# Patient Record
Sex: Female | Born: 1940
Health system: Southern US, Community
[De-identification: ages and names within clinical notes are randomized; demographics above are authoritative.]

## PROBLEM LIST (undated history)

## (undated) DIAGNOSIS — I10 Essential (primary) hypertension: Secondary | ICD-10-CM

## (undated) DIAGNOSIS — M549 Dorsalgia, unspecified: Secondary | ICD-10-CM

## (undated) HISTORY — PX: CHOLECYSTECTOMY: SHX55

---

## 2003-03-06 ENCOUNTER — Encounter: Payer: Self-pay | Admitting: Neurosurgery

## 2003-03-08 ENCOUNTER — Encounter: Payer: Self-pay | Admitting: Neurosurgery

## 2003-03-08 ENCOUNTER — Inpatient Hospital Stay (HOSPITAL_COMMUNITY): Admission: RE | Admit: 2003-03-08 | Discharge: 2003-03-09 | Payer: Self-pay | Admitting: Neurosurgery

## 2003-04-10 ENCOUNTER — Encounter: Admission: RE | Admit: 2003-04-10 | Discharge: 2003-04-10 | Payer: Self-pay | Admitting: Neurosurgery

## 2003-04-10 ENCOUNTER — Encounter: Payer: Self-pay | Admitting: Neurosurgery

## 2003-06-12 ENCOUNTER — Encounter: Payer: Self-pay | Admitting: Neurosurgery

## 2003-06-12 ENCOUNTER — Encounter: Admission: RE | Admit: 2003-06-12 | Discharge: 2003-06-12 | Payer: Self-pay | Admitting: Neurosurgery

## 2016-07-29 ENCOUNTER — Encounter (HOSPITAL_BASED_OUTPATIENT_CLINIC_OR_DEPARTMENT_OTHER): Payer: Self-pay | Admitting: *Deleted

## 2016-07-29 ENCOUNTER — Emergency Department (HOSPITAL_BASED_OUTPATIENT_CLINIC_OR_DEPARTMENT_OTHER)
Admission: EM | Admit: 2016-07-29 | Discharge: 2016-07-29 | Disposition: A | Discharge status: Home / Self Care | Payer: Medicare HMO | Attending: Emergency Medicine | Admitting: Emergency Medicine

## 2016-07-29 ENCOUNTER — Emergency Department (HOSPITAL_BASED_OUTPATIENT_CLINIC_OR_DEPARTMENT_OTHER): Payer: Medicare HMO

## 2016-07-29 DIAGNOSIS — Y939 Activity, unspecified: Secondary | ICD-10-CM | POA: Diagnosis not present

## 2016-07-29 DIAGNOSIS — S301XXA Contusion of abdominal wall, initial encounter: Secondary | ICD-10-CM | POA: Diagnosis not present

## 2016-07-29 DIAGNOSIS — W19XXXA Unspecified fall, initial encounter: Secondary | ICD-10-CM

## 2016-07-29 DIAGNOSIS — W01119A Fall on same level from slipping, tripping and stumbling with subsequent striking against unspecified sharp object, initial encounter: Secondary | ICD-10-CM | POA: Diagnosis not present

## 2016-07-29 DIAGNOSIS — Y9289 Other specified places as the place of occurrence of the external cause: Secondary | ICD-10-CM | POA: Diagnosis not present

## 2016-07-29 DIAGNOSIS — S0990XA Unspecified injury of head, initial encounter: Secondary | ICD-10-CM

## 2016-07-29 DIAGNOSIS — Y999 Unspecified external cause status: Secondary | ICD-10-CM | POA: Insufficient documentation

## 2016-07-29 DIAGNOSIS — S01112A Laceration without foreign body of left eyelid and periocular area, initial encounter: Secondary | ICD-10-CM | POA: Diagnosis not present

## 2016-07-29 HISTORY — DX: Essential (primary) hypertension: I10

## 2016-07-29 HISTORY — DX: Dorsalgia, unspecified: M54.9

## 2016-07-29 LAB — CBC WITH DIFFERENTIAL/PLATELET
Basophils Absolute: 0 10*3/uL (ref 0.0–0.1)
Basophils Relative: 0 %
EOS ABS: 0 10*3/uL (ref 0.0–0.7)
EOS PCT: 0 %
HCT: 32.9 % — ABNORMAL LOW (ref 36.0–46.0)
HEMOGLOBIN: 10.7 g/dL — AB (ref 12.0–15.0)
LYMPHS ABS: 1.1 10*3/uL (ref 0.7–4.0)
Lymphocytes Relative: 13 %
MCH: 29.8 pg (ref 26.0–34.0)
MCHC: 32.5 g/dL (ref 30.0–36.0)
MCV: 91.6 fL (ref 78.0–100.0)
MONO ABS: 0.5 10*3/uL (ref 0.1–1.0)
MONOS PCT: 6 %
Neutro Abs: 6.7 10*3/uL (ref 1.7–7.7)
Neutrophils Relative %: 81 %
PLATELETS: 308 10*3/uL (ref 150–400)
RBC: 3.59 MIL/uL — ABNORMAL LOW (ref 3.87–5.11)
RDW: 13.1 % (ref 11.5–15.5)
WBC: 8.2 10*3/uL (ref 4.0–10.5)

## 2016-07-29 LAB — COMPREHENSIVE METABOLIC PANEL
ALK PHOS: 58 U/L (ref 38–126)
ALT: 21 U/L (ref 14–54)
ANION GAP: 11 (ref 5–15)
AST: 27 U/L (ref 15–41)
Albumin: 4.2 g/dL (ref 3.5–5.0)
BUN: 29 mg/dL — ABNORMAL HIGH (ref 6–20)
CALCIUM: 9.7 mg/dL (ref 8.9–10.3)
CO2: 20 mmol/L — AB (ref 22–32)
Chloride: 106 mmol/L (ref 101–111)
Creatinine, Ser: 1.28 mg/dL — ABNORMAL HIGH (ref 0.44–1.00)
GFR calc non Af Amer: 40 mL/min — ABNORMAL LOW (ref >60)
GFR, EST AFRICAN AMERICAN: 46 mL/min — AB (ref >60)
Glucose, Bld: 84 mg/dL (ref 65–99)
Potassium: 4.5 mmol/L (ref 3.5–5.1)
SODIUM: 137 mmol/L (ref 135–145)
Total Bilirubin: 0.5 mg/dL (ref 0.3–1.2)
Total Protein: 7.9 g/dL (ref 6.5–8.1)

## 2016-07-29 MED ORDER — TETANUS-DIPHTH-ACELL PERTUSSIS 5-2.5-18.5 LF-MCG/0.5 IM SUSP: 0.5000 mL | Freq: Once | INTRAMUSCULAR | Status: DC

## 2016-07-29 MED ORDER — MORPHINE SULFATE (PF) 4 MG/ML IV SOLN
4.0000 mg | Freq: Once | INTRAVENOUS | Status: AC
  Administered 2016-07-29: 4 mg via INTRAVENOUS
  Filled 2016-07-29: qty 1

## 2016-07-29 MED ORDER — SODIUM CHLORIDE 0.9 % IV BOLUS (SEPSIS)
500.0000 mL | Freq: Once | INTRAVENOUS | Status: AC
  Administered 2016-07-29: 500 mL via INTRAVENOUS

## 2016-07-29 MED ORDER — LIDOCAINE-EPINEPHRINE (PF) 2 %-1:200000 IJ SOLN
10.0000 mL | Freq: Once | INTRAMUSCULAR | Status: AC
  Administered 2016-07-29: 10 mL
  Filled 2016-07-29: qty 10

## 2016-07-29 MED ORDER — IOPAMIDOL (ISOVUE-300) INJECTION 61%
100.0000 mL | Freq: Once | INTRAVENOUS | Status: AC | PRN
  Administered 2016-07-29: 100 mL via INTRAVENOUS

## 2016-07-29 NOTE — ED Notes (Signed)
PA at bedside.

## 2016-07-29 NOTE — ED Notes (Signed)
Patient transported to CT 

## 2016-07-29 NOTE — ED Provider Notes (Signed)
[facility]-EMERGENCY DEPT Provider Note   CSN: [id number] Arrival date & time: 07/29/16  1521  By signing my name below, I, Emmanuella Mensah, attest that this documentation has been prepared under the direction and in the presence of Applied MaterialsJessica Focht, PA-C. Electronically Signed: Angelene GiovanniEmmanuella Mensah, ED Scribe. 07/29/16. 5:19 PM.   History   Chief Complaint Chief Complaint  Patient presents with  . Fall    HPI Comments: Christina Harrison is a 75 y.o. female with a hx of HTN who presents to the Emergency Department complaining of a laceration above her left eyebrow, persistent moderate left frontal headache, and moderate right rib pain with bruising s/p fall that occurred 5 hours ago. She reports one associated episode of vomiting immediately after the fall. She explains that she was fishing when she stood up to ambulate but tripped over a stump falling on her right side unto the stump and striking her head onto the ground. She endorses positive LOC that lasted for approximately 10 minutes. She adds that she was dizzy when she gained consciousness. She states that she was able to drive home after the fall. No alleviating factors noted. Pt has not tried any medications PTA. No anti-coagulant use noted. Pt's tetanus vaccine is UTD.  Pt has not ambulated since being in the ED. She denies any acute visual disturbances, lightheadedness, numbness/tingling in BLE, shortness of breath, chest pain, nausea, abdominal pain, or any generalized rash.   The history is provided by the patient. No language interpreter was used.    Past Medical History:  Diagnosis Date  . Back pain   . Hypertension     There are no active problems to display for this patient.   Past Surgical History:  Procedure Laterality Date  . CHOLECYSTECTOMY      OB History    No data available       Home Medications    Prior to Admission medications   Medication Sig Start Date End Date Taking? Authorizing Provider  traMADol  (ULTRAM) 50 MG tablet Take by mouth every 6 (six) hours as needed.   Yes Historical Provider, MD  UNKNOWN TO PATIENT    Yes Historical Provider, MD    Family History History reviewed. No pertinent family history.  Social History Social History  Substance Use Topics  . Smoking status: Never Smoker  . Smokeless tobacco: Current User    Types: Snuff  . Alcohol use Yes     Comment: >4 cans of beer daily     Allergies   Review of patient's allergies indicates no known allergies.   Review of Systems Review of Systems  Eyes: Negative for visual disturbance.  Respiratory: Negative for shortness of breath.   Cardiovascular: Negative for chest pain.  Gastrointestinal: Positive for vomiting. Negative for abdominal pain and nausea.  Musculoskeletal: Positive for arthralgias.  Skin: Positive for wound. Negative for rash.  Neurological: Positive for dizziness and headaches. Negative for light-headedness and numbness.  All other systems reviewed and are negative.    Physical Exam Updated Vital Signs BP 118/74 (BP Location: Right Arm)   Pulse 86   Temp 99.3 F (37.4 C) (Oral)   Resp 18   Ht 5\' 6"  (1.676 m)   Wt 142 lb (64.4 kg)   SpO2 99%   BMI 22.92 kg/m   Physical Exam  Constitutional: She is oriented to person, place, and time. She appears well-developed and well-nourished. No distress.  HENT:  Head: Normocephalic.  Left Ear: Tympanic membrane and ear canal  normal. No hemotympanum.  Cannot visualize right ear due to cerumen  1.5 cm laceration noted above medial left eyebrow  Eyes: EOM are normal. Pupils are equal, round, and reactive to light. Right conjunctiva is not injected. Left conjunctiva is injected.  Injected conjunctiva to left but not the right  Neck: Normal range of motion.  Cardiovascular: Normal rate, regular rhythm and normal heart sounds.   Pulmonary/Chest: Effort normal and breath sounds normal. No respiratory distress.  Abdominal: Soft. Bowel sounds are  normal. She exhibits no distension. There is no tenderness.  No abdominal ecchymosis  Musculoskeletal: Normal range of motion.  No tenderness on spine or paraspinal muscles  Tenderness to right posterior rib with small area of ecchymosis noted to right flank Small area of ecchymosis noted to left medial ankle 2+ DP pulses Strength 5/5 BLE   Neurological: She is alert and oriented to person, place, and time. She has normal strength. No cranial nerve deficit or sensory deficit. Coordination normal. GCS eye subscore is 4. GCS verbal subscore is 5. GCS motor subscore is 6.  Coordination intact No pronator drift   Skin: Skin is warm and dry. She is not diaphoretic.  Psychiatric: She has a normal mood and affect. Her behavior is normal.  Nursing note and vitals reviewed.   ED Treatments / Results  DIAGNOSTIC STUDIES: Oxygen Saturation is 99% on RA, normal by my interpretation.    COORDINATION OF CARE: 5:12 PM- Pt advised of plan for treatment and pt agrees. Pt informed of her head CT results. She will receive a laceration repair. She will also receive lab work and CT abdomen for further evaluation.    Labs (all labs ordered are listed, but only abnormal results are displayed) Labs Reviewed  CBC WITH DIFFERENTIAL/PLATELET - Abnormal; Notable for the following:       Result Value   RBC 3.59 (*)    Hemoglobin 10.7 (*)    HCT 32.9 (*)    All other components within normal limits  COMPREHENSIVE METABOLIC PANEL - Abnormal; Notable for the following:    CO2 20 (*)    BUN 29 (*)    Creatinine, Ser 1.28 (*)    GFR calc non Af Amer 40 (*)    GFR calc Af Amer 46 (*)    All other components within normal limits    Results for orders placed or performed during the hospital encounter of 07/29/16  CBC with Differential  Result Value Ref Range   WBC 8.2 4.0 - 10.5 K/uL   RBC 3.59 (L) 3.87 - 5.11 MIL/uL   Hemoglobin 10.7 (L) 12.0 - 15.0 g/dL   HCT 16.1 (L) 09.6 - 04.5 %   MCV 91.6 78.0 -  100.0 fL   MCH 29.8 26.0 - 34.0 pg   MCHC 32.5 30.0 - 36.0 g/dL   RDW 40.9 81.1 - 91.4 %   Platelets 308 150 - 400 K/uL   Neutrophils Relative % 81 %   Neutro Abs 6.7 1.7 - 7.7 K/uL   Lymphocytes Relative 13 %   Lymphs Abs 1.1 0.7 - 4.0 K/uL   Monocytes Relative 6 %   Monocytes Absolute 0.5 0.1 - 1.0 K/uL   Eosinophils Relative 0 %   Eosinophils Absolute 0.0 0.0 - 0.7 K/uL   Basophils Relative 0 %   Basophils Absolute 0.0 0.0 - 0.1 K/uL  Comprehensive metabolic panel  Result Value Ref Range   Sodium 137 135 - 145 mmol/L   Potassium 4.5 3.5 - 5.1  mmol/L   Chloride 106 101 - 111 mmol/L   CO2 20 (L) 22 - 32 mmol/L   Glucose, Bld 84 65 - 99 mg/dL   BUN 29 (H) 6 - 20 mg/dL   Creatinine, Ser 1.61 (H) 0.44 - 1.00 mg/dL   Calcium 9.7 8.9 - 09.6 mg/dL   Total Protein 7.9 6.5 - 8.1 g/dL   Albumin 4.2 3.5 - 5.0 g/dL   AST 27 15 - 41 U/L   ALT 21 14 - 54 U/L   Alkaline Phosphatase 58 38 - 126 U/L   Total Bilirubin 0.5 0.3 - 1.2 mg/dL   GFR calc non Af Amer 40 (L) >60 mL/min   GFR calc Af Amer 46 (L) >60 mL/min   Anion gap 11 5 - 15   Ct Head Wo Contrast  Result Date: 07/29/2016 CLINICAL DATA:  Pain following fall with loss of consciousness EXAM: CT HEAD WITHOUT CONTRAST TECHNIQUE: Contiguous axial images were obtained from the base of the skull through the vertex without intravenous contrast. COMPARISON:  None. FINDINGS: Brain: The ventricles are normal in size and configuration. There is no intracranial mass, hemorrhage, extra-axial fluid collection, or midline shift. Gray-white compartments appear normal. No acute infarct evident. There is a mild degree of basal ganglia calcification bilaterally. Vascular: There is no hyperdense vessel. There is no appreciable arterial vascular calcification. Skull: The bony calvarium appears intact. Sinuses/Orbits: There is opacity in anterior ethmoid air cells bilaterally, more on the left than on the right. There is opacification in the posterior right  sphenoid sinus. Visualized paranasal sinuses elsewhere clear. Orbits appear symmetric bilaterally. Other: There is opacification in several inferior mastoid air cells on the right. Mastoid air cells elsewhere clear. IMPRESSION: No intracranial mass, hemorrhage, or extra-axial fluid collection. Gray-white compartments appear normal. Mild basal ganglia calcification is felt to be physiologic in this age group. There is mild inferior right-sided mastoid disease. There is mild ethmoid sinus disease bilaterally, slightly more on the left than on the right. There is also opacification in the posterior aspect of the right sphenoid sinus. Electronically Signed   By: Bretta Bang III M.D.   On: 07/29/2016 16:11   Ct Abdomen Pelvis W Contrast  Result Date: 07/29/2016 CLINICAL DATA:  75 y/o F; status post fall with pain and bruising on the right flank. History of cholecystectomy and hysterectomy. EXAM: CT ABDOMEN AND PELVIS WITH CONTRAST TECHNIQUE: Multidetector CT imaging of the abdomen and pelvis was performed using the standard protocol following bolus administration of intravenous contrast. CONTRAST:  ISOVUE-300 IOPAMIDOL (ISOVUE-300) INJECTION 61% COMPARISON:  None. FINDINGS: Lower chest: No acute abnormality. Hepatobiliary: No focal liver abnormality is seen. Status post cholecystectomy. Prominent extrahepatic common bile duct is within normal limits postcholecystectomy. Punctate calcification within the liver hilum (series 2, image 13) may represent a bile duct stone versus vascular calcification. No intrahepatic biliary ductal dilatation. Pancreas: Unremarkable. No pancreatic ductal dilatation or surrounding inflammatory changes. Spleen: Normal in size without focal abnormality. Adrenals/Urinary Tract: Tiny cortical lucencies bilaterally probably represent cysts. Normal adrenal glands. No urinary stone or obstructive uropathy identified. Normal bladder. Stomach/Bowel: Small hiatal hernia. No evidence for  obstruction or inflammatory changes of the bowel. Scattered diverticulosis of the descending colon. Normal appendix. Vascular/Lymphatic: No lymphadenopathy. Dense calcifications of bilateral renal artery origins, the calcification excludes accurate assessment of luminal stenosis. Aortic atherosclerosis with severe calcification of the infrarenal abdominal aorta. No aneurysm or dissection. Reproductive: Status post hysterectomy. No adnexal masses. Other: Right ischium 6 mm sclerotic focus  is likely a bone island. Minor stranding of right flank subcutaneous fat is compatible with contusion. Musculoskeletal: No fracture is seen. IMPRESSION: No evidence for internal injury or acute fracture is identified. Minor contusion of right flank subcutaneous fat. Electronically Signed   By: Mitzi Hansen M.D.   On: 07/29/2016 19:30     EKG  EKG Interpretation  Date/Time:  Tuesday July 29 2016 16:27:41 EDT Ventricular Rate:  85 PR Interval:    QRS Duration: 86 QT Interval:  380 QTC Calculation: 452 R Axis:   58 Text Interpretation:  Sinus rhythm Nonspecific T abnormalities, diffuse leads no significant change since 2004 Confirmed by GOLDSTON MD, SCOTT [phone]) on 07/29/2016 4:56:21 PM       Radiology No results found.  Procedures .Marland KitchenLaceration Repair Date/Time: 07/29/2016 5:15 PM Performed by: Rhona Raider, JESSICA L Authorized by: Mattie Marlin L   Consent:    Consent obtained:  Verbal   Consent given by:  Patient   Risks discussed:  Pain Anesthesia (see MAR for exact dosages):    Anesthesia method:  Local infiltration   Local anesthetic:  Lidocaine 2% WITH epi Laceration details:    Location:  Face   Face location:  L eyebrow   Length (cm):  1.5 Repair type:    Repair type:  Simple Pre-procedure details:    Preparation:  Patient was prepped and draped in usual sterile fashion Exploration:    Wound exploration: entire depth of wound probed and visualized   Treatment:    Area  cleansed with:  Betadine   Amount of cleaning:  Standard   Irrigation solution:  Sterile saline   Irrigation method:  Pressure wash   Visualized foreign bodies/material removed: yes   Skin repair:    Repair method:  Sutures   Suture size:  5-0   Suture material:  Prolene   Number of sutures:  6 Approximation:    Approximation:  Close   Vermilion border: well-aligned   Post-procedure details:    Dressing:  Antibiotic ointment   Patient tolerance of procedure:  Tolerated well, no immediate complications    (including critical care time)  Medications Ordered in ED Medications  lidocaine-EPINEPHrine (XYLOCAINE W/EPI) 2 %-1:200000 (PF) injection 10 mL (10 mLs Infiltration Given 07/29/16 1728)  morphine 4 MG/ML injection 4 mg (4 mg Intravenous Given 07/29/16 1728)  sodium chloride 0.9 % bolus 500 mL (0 mLs Intravenous Stopped 07/29/16 1850)  iopamidol (ISOVUE-300) 61 % injection 100 mL (100 mLs Intravenous Contrast Given 07/29/16 1900)     Initial Impression / Assessment and Plan / ED Course  Mattie Marlin, PA-C has reviewed the triage vital signs and the nursing notes.  Pertinent labs & imaging results that were available during my care of the patient were reviewed by me and considered in my medical decision making (see chart for details).  Clinical Course   Well-appearing alert, oriented patient, GCS 15, negative neuro exam. Patient not on anticoagulation. Patient with head injury and loss of consciousness. Patient also with area of ecchymosis on right flank. Indication for imaging. CT of head reviewed by me revealed no acute intracranial bleed or abnormalities. CT abdomen pelvis revealed me revealed minor contusion of right subcutaneous fat but no other intra-abdominal abnormalities. Laceration to left eyebrow.  Tdap vaccine up to date according to pt. Pressure irrigation performed. Laceration occurred < 8 hours prior to repair which was well tolerated. Pt has no co morbidities to  effect normal wound healing. Discussed suture home care w pt and answered  questions. Pt to f-u for wound check and suture removal in 5-7 days. Pt is hemodynamically stable w no complaints. Discussed ice and pain medication.  Labs reassuring, VSS. Patient with low hemoglobin although close to patient's baseline. Review of patient's chart revealed hemoglobin 11.1 in 2003. No other CBC results available. Also serum creatinine slightly elevated to 1.28. Serum creatinine was 1.2 in 2003. No other previous results available.  Patient stable at time of discharge. Instructed patient to follow-up with her primary care provider in 2 days be reevaluated. Discussed strict return precautions to the ED. I discussed all of the results with the patient and family members they have expressed their understanding to the verbal discharge instructions.  Patient case discussed and patient seen by Dr. Hedwig Morton who agrees with the above plan.  Final Clinical Impressions(s) / ED Diagnoses   Final diagnoses:  Fall, initial encounter  Head injury, initial encounter  Eyebrow laceration, left, initial encounter  Contusion of flank, initial encounter    New Prescriptions Discharge Medication List as of 07/29/2016  8:27 PM     I personally performed the services described in this documentation, which was scribed in my presence. The recorded information has been reviewed and is accurate.      Jerre Simon, PA 08/02/16 1533    Pricilla Loveless, MD 08/04/16 [phone]

## 2016-07-29 NOTE — Discharge Instructions (Signed)
Return here to emergency department in 3-5 days to have your sutures removed. Keep the wound clean and dry and apply antibiotic ointment once daily. Follow-up with your primary care provider in 2 days to be reevaluated. Take ibuprofen as needed for pain and headache. Return immediately to the emergency department if you experience worsening headache, visual changes, numbness, infection around your wound (redness, pain, warmth, red streaks, foul discharge, fever) abdominal pain, nausea, vomiting, blood in your stool or blood in your urine, or any other concerning symptoms.

## 2016-07-29 NOTE — ED Triage Notes (Signed)
Pt reports that she tripped over a branch and hit her head.  Reports LOC.  Noted to have a laceration above her left eye.  No change in vision, reports HA.  Denies dizziness.  Reports vomiting x 1.

## 2016-08-03 ENCOUNTER — Emergency Department (HOSPITAL_BASED_OUTPATIENT_CLINIC_OR_DEPARTMENT_OTHER)
Admission: EM | Admit: 2016-08-03 | Discharge: 2016-08-03 | Disposition: A | Discharge status: Home / Self Care | Payer: Medicare HMO | Attending: Emergency Medicine | Admitting: Emergency Medicine

## 2016-08-03 ENCOUNTER — Encounter (HOSPITAL_BASED_OUTPATIENT_CLINIC_OR_DEPARTMENT_OTHER): Payer: Self-pay | Admitting: Emergency Medicine

## 2016-08-03 DIAGNOSIS — Z4802 Encounter for removal of sutures: Secondary | ICD-10-CM | POA: Insufficient documentation

## 2016-08-03 DIAGNOSIS — I10 Essential (primary) hypertension: Secondary | ICD-10-CM | POA: Diagnosis not present

## 2016-08-03 DIAGNOSIS — S01112D Laceration without foreign body of left eyelid and periocular area, subsequent encounter: Secondary | ICD-10-CM | POA: Diagnosis present

## 2016-08-03 DIAGNOSIS — X58XXXD Exposure to other specified factors, subsequent encounter: Secondary | ICD-10-CM | POA: Insufficient documentation

## 2016-08-03 DIAGNOSIS — F17290 Nicotine dependence, other tobacco product, uncomplicated: Secondary | ICD-10-CM | POA: Insufficient documentation

## 2016-08-03 NOTE — ED Provider Notes (Signed)
[facility]   CSN: [id number] Arrival date & time: 08/03/16  0905    History   Chief Complaint Chief Complaint  Patient presents with  . Suture / Staple Removal    HPI Christina Harrison is a 75 y.o. female.  HPI   75 year old female presents today for suture removal. Patient was seen on 07/29/2016 status post fall. She suffered a laceration to the left orbit. 6 sutures were placed. She denies any signs of infection, denies any significant complaints. Patient reports she's been doing well.    Past Medical History:  Diagnosis Date  . Back pain   . Hypertension     There are no active problems to display for this patient.   Past Surgical History:  Procedure Laterality Date  . CHOLECYSTECTOMY      OB History    No data available       Home Medications    Prior to Admission medications   Medication Sig Start Date End Date Taking? Authorizing Provider  traMADol (ULTRAM) 50 MG tablet Take by mouth every 6 (six) hours as needed.    Historical Provider, MD  UNKNOWN TO PATIENT     Historical Provider, MD    Family History No family history on file.  Social History Social History  Substance Use Topics  . Smoking status: Never Smoker  . Smokeless tobacco: Current User    Types: Snuff  . Alcohol use Yes     Comment: >4 cans of beer daily     Allergies   Review of patient's allergies indicates no known allergies.   Review of Systems Review of Systems  All other systems reviewed and are negative.    Physical Exam Updated Vital Signs BP 99/63 (BP Location: Right Arm)   Pulse 78   Temp 98.4 F (36.9 C) (Oral)   Resp 18   Ht 5\' 6"  (1.676 m)   Wt 63 kg   SpO2 98%   BMI 22.44 kg/m   Physical Exam  HENT:  1.5 centimeter laceration to the left superior orbit and eyebrow, wound healing appropriately with good approximation, no signs of surrounding infection  Nursing note and vitals reviewed.    ED Treatments / Results    Labs (all labs ordered are listed, but only abnormal results are displayed) Labs Reviewed - No data to display  EKG  EKG Interpretation None       Radiology No results found.  Procedures Procedures (including critical care time)  Medications Ordered in ED Medications - No data to display   Initial Impression / Assessment and Plan / ED Course  I have reviewed the triage vital signs and the nursing notes.  Pertinent labs & imaging results that were available during my care of the patient were reviewed by me and considered in my medical decision making (see chart for details).  Clinical Course      Final Clinical Impressions(s) / ED Diagnoses   Final diagnoses:  Visit for suture removal    Labs:  Imaging:  Consults:  Therapeutics:  Discharge Meds:   Assessment/Plan:   75 year old female presents for suture removal. 6 sutures were removed here without difficulty. She has no signs of infectious etiology, no complaints. She'll be discharged home with continued care instructions for the wound and strict return precautions. She verbalized understanding and agreement today's plan had no further questions or concerns     New Prescriptions New Prescriptions   No medications on file  Christina MechanicJeffrey Marie Borowski, PA-C 08/03/16 [id number]    Jerelyn ScottMartha Linker, MD 08/03/16 [phone]

## 2016-08-03 NOTE — ED Triage Notes (Signed)
Pt here for suture removal

## 2016-08-03 NOTE — Discharge Instructions (Signed)
Please monitor for signs of infection return immediately if any present.

## 2017-08-04 ENCOUNTER — Emergency Department (HOSPITAL_BASED_OUTPATIENT_CLINIC_OR_DEPARTMENT_OTHER)
Admission: EM | Admit: 2017-08-04 | Discharge: 2017-08-04 | Disposition: A | Discharge status: Home / Self Care | Payer: Medicare HMO | Attending: Emergency Medicine | Admitting: Emergency Medicine

## 2017-08-04 ENCOUNTER — Encounter (HOSPITAL_BASED_OUTPATIENT_CLINIC_OR_DEPARTMENT_OTHER): Payer: Self-pay | Admitting: *Deleted

## 2017-08-04 ENCOUNTER — Emergency Department (HOSPITAL_BASED_OUTPATIENT_CLINIC_OR_DEPARTMENT_OTHER): Payer: Medicare HMO

## 2017-08-04 DIAGNOSIS — Y92012 Bathroom of single-family (private) house as the place of occurrence of the external cause: Secondary | ICD-10-CM | POA: Insufficient documentation

## 2017-08-04 DIAGNOSIS — F17228 Nicotine dependence, chewing tobacco, with other nicotine-induced disorders: Secondary | ICD-10-CM | POA: Insufficient documentation

## 2017-08-04 DIAGNOSIS — W19XXXA Unspecified fall, initial encounter: Secondary | ICD-10-CM | POA: Insufficient documentation

## 2017-08-04 DIAGNOSIS — S2231XA Fracture of one rib, right side, initial encounter for closed fracture: Secondary | ICD-10-CM | POA: Insufficient documentation

## 2017-08-04 DIAGNOSIS — I10 Essential (primary) hypertension: Secondary | ICD-10-CM | POA: Diagnosis not present

## 2017-08-04 DIAGNOSIS — S299XXA Unspecified injury of thorax, initial encounter: Secondary | ICD-10-CM | POA: Diagnosis present

## 2017-08-04 DIAGNOSIS — R109 Unspecified abdominal pain: Secondary | ICD-10-CM | POA: Diagnosis not present

## 2017-08-04 DIAGNOSIS — Y9389 Activity, other specified: Secondary | ICD-10-CM | POA: Diagnosis not present

## 2017-08-04 DIAGNOSIS — R079 Chest pain, unspecified: Secondary | ICD-10-CM | POA: Diagnosis not present

## 2017-08-04 DIAGNOSIS — Y998 Other external cause status: Secondary | ICD-10-CM | POA: Diagnosis not present

## 2017-08-04 MED ORDER — HYDROCODONE-ACETAMINOPHEN 5-325 MG PO TABS: 1.0000 | ORAL_TABLET | Freq: Four times a day (QID) | ORAL | 0 refills | Status: AC | PRN

## 2017-08-04 MED ORDER — KETOROLAC TROMETHAMINE 30 MG/ML IJ SOLN
15.0000 mg | Freq: Once | INTRAMUSCULAR | Status: AC
  Administered 2017-08-04: 15 mg via INTRAMUSCULAR
  Filled 2017-08-04: qty 1

## 2017-08-04 MED FILL — HYDROCODON-APAP 5-325: 5-325 | 1 days supply | Qty: 8 | Fill #0

## 2017-08-04 NOTE — ED Provider Notes (Signed)
[facility] Provider Note   CSN: [id number] Arrival date & time: 08/04/17  1018     History   Chief Complaint Chief Complaint  Patient presents with  . Fall    HPI Christina Harrison is a 76 y.o. female.   Fall  Associated symptoms include chest pain. Pertinent negatives include no abdominal pain and no shortness of breath.   Patient presents after fall 2 days ago. Says she is trying to sit on her bed and missed hitting her right flank area on the bedside table. Since she's had pain since. No difficulty breathing. No lightheadedness or dizziness. No abdominal pain. States she's been having difficulty sleeping due to the pain. States she thinks she broke a rib. Past Medical History:  Diagnosis Date  . Back pain   . Hypertension     There are no active problems to display for this patient.   Past Surgical History:  Procedure Laterality Date  . CHOLECYSTECTOMY      OB History    No data available       Home Medications    Prior to Admission medications   Medication Sig Start Date End Date Taking? Authorizing Provider  HYDROcodone-acetaminophen (NORCO/VICODIN) 5-325 MG tablet Take 1-2 tablets by mouth every 6 (six) hours as needed. 08/04/17   Benjiman Core, MD  traMADol (ULTRAM) 50 MG tablet Take by mouth every 6 (six) hours as needed.    [provider]  UNKNOWN TO PATIENT     [provider]    Family History History reviewed. No pertinent family history.  Social History Social History  Substance Use Topics  . Smoking status: Never Smoker  . Smokeless tobacco: Current User    Types: Snuff  . Alcohol use Yes     Comment: >4 cans of beer daily     Allergies   Patient has no known allergies.   Review of Systems Review of Systems  Constitutional: Negative for appetite change and fever.  HENT: Negative for congestion.   Respiratory: Negative for shortness of breath.   Cardiovascular: Positive for chest pain.    Gastrointestinal: Negative for abdominal pain.  Genitourinary: Positive for flank pain. Negative for hematuria.  Musculoskeletal: Negative for back pain.  Neurological: Negative for syncope.     Physical Exam Updated Vital Signs BP 109/75 (BP Location: Right Arm)   Pulse 79   Temp 98 F (36.7 C) (Oral)   Resp 18   Ht  (1.676 m)   Wt 67.1 kg (148 lb)   SpO2 97%   BMI 23.89 kg/m   Physical Exam  Constitutional: She appears well-developed.  HENT:  Head: Atraumatic.  Eyes: Pupils are equal, round, and reactive to light.  Neck: Neck supple.  Cardiovascular: Normal rate.   Pulmonary/Chest: She exhibits tenderness.  Tenderness over right lateral lower chest wall. May have small amount of bony tenderness. No subcutaneous emphysema. Slight ecchymosis. Equal breath sounds  Abdominal: There is no tenderness.  Musculoskeletal: She exhibits no edema.  Neurological: She is alert.  Skin: Skin is warm. Capillary refill takes less than 2 seconds.     ED Treatments / Results  Labs (all labs ordered are listed, but only abnormal results are displayed) Labs Reviewed  URINALYSIS, ROUTINE W REFLEX MICROSCOPIC    EKG  EKG Interpretation  Date/Time:  Tuesday August 04 2017 10:39:26 EDT Ventricular Rate:  78 PR Interval:  176 QRS Duration: 96 QT Interval:  392 QTC Calculation: 446 R Axis:  125 Text Interpretation:  Normal sinus rhythm Right axis deviation Possible Right ventricular hypertrophy ST & T wave abnormality, consider inferior ischemia ST & T wave abnormality, consider anterolateral ischemia Abnormal ECG St and T changes are new Confirmed by Benjiman Core [phone]) on 08/04/2017 10:50:43 AM       Radiology Dg Ribs Unilateral W/chest Right  Result Date: 08/04/2017 CLINICAL DATA:  Fall several days ago with persistent right-sided chest pain, initial encounter EXAM: RIGHT RIBS AND CHEST - 3+ VIEW COMPARISON:  None. FINDINGS: Cardiac shadow is enlarged. Aortic  calcifications are noted. The lungs are well aerated bilaterally with mild right basilar atelectasis. No pneumothorax is seen. No definitive rib fracture is noted. IMPRESSION: Mild right basilar atelectasis.  No definitive rib fracture noted. Electronically Signed   By: Alcide Clever M.D.   On: 08/04/2017 11:27    Procedures Procedures (including critical care time)  Medications Ordered in ED Medications  ketorolac (TORADOL) 30 MG/ML injection 15 mg (15 mg Intramuscular Given 08/04/17 1251)     Initial Impression / Assessment and Plan / ED Course  I have reviewed the triage vital signs and the nursing notes.  Pertinent labs & imaging results that were available during my care of the patient were reviewed by me and considered in my medical decision making (see chart for details).      patient post fall. Right-sided chest pain. Did have tenderness and possible crepitance. Clinically has a rib fracture although not visualized on x-ray. Will empirically treat. Discharge home.  Final Clinical Impressions(s) / ED Diagnoses   Final diagnoses:  Fall, initial encounter  Closed fracture of one rib of right side, initial encounter    New Prescriptions Discharge Medication List as of 08/04/2017 12:42 PM    START taking these medications   Details  HYDROcodone-acetaminophen (NORCO/VICODIN) 5-325 MG tablet Take 1-2 tablets by mouth every 6 (six) hours as needed., Starting Tue 08/04/2017, Print         Benjiman Core, MD 08/04/17 1540

## 2017-08-04 NOTE — ED Triage Notes (Signed)
Pt reports trip and fall into her night stand while getting into bed Sunday night. Pt denies head injury or loc, c/o right rib pain and pain with deep inspiration since the fall.

## 2017-08-04 NOTE — ED Notes (Signed)
Pt voided and did not collect urine (was not asked to prior to voiding); EDP aware.

## 2021-11-18 ENCOUNTER — Emergency Department (HOSPITAL_BASED_OUTPATIENT_CLINIC_OR_DEPARTMENT_OTHER)
Admission: EM | Admit: 2021-11-18 | Discharge: 2021-11-18 | Disposition: A | Discharge status: Home / Self Care | Payer: Medicare HMO | Attending: Emergency Medicine | Admitting: Emergency Medicine

## 2021-11-18 ENCOUNTER — Other Ambulatory Visit (HOSPITAL_BASED_OUTPATIENT_CLINIC_OR_DEPARTMENT_OTHER): Payer: Self-pay

## 2021-11-18 ENCOUNTER — Emergency Department (HOSPITAL_BASED_OUTPATIENT_CLINIC_OR_DEPARTMENT_OTHER): Payer: Medicare HMO

## 2021-11-18 ENCOUNTER — Encounter (HOSPITAL_BASED_OUTPATIENT_CLINIC_OR_DEPARTMENT_OTHER): Payer: Self-pay | Admitting: *Deleted

## 2021-11-18 ENCOUNTER — Other Ambulatory Visit: Payer: Self-pay

## 2021-11-18 DIAGNOSIS — I10 Essential (primary) hypertension: Secondary | ICD-10-CM | POA: Insufficient documentation

## 2021-11-18 DIAGNOSIS — M25512 Pain in left shoulder: Secondary | ICD-10-CM | POA: Diagnosis present

## 2021-11-18 MED ORDER — DICLOFENAC SODIUM 1 % EX GEL
4.0000 g | Freq: Four times a day (QID) | CUTANEOUS | 0 refills | Status: AC
  Filled 2021-11-18: qty 100, 6d supply, fill #0

## 2021-11-18 MED ORDER — LIDOCAINE 5 % EX PTCH
1.0000 | MEDICATED_PATCH | CUTANEOUS | 0 refills | Status: AC
  Filled 2021-11-18: qty 15, 15d supply, fill #0

## 2021-11-18 NOTE — ED Notes (Signed)
DC papers given to patient and instructions reviewed with pt as well as family member. Pt voiced understanding. Denies need for a wheelchair.

## 2021-11-18 NOTE — ED Notes (Signed)
Presents to the ED today with c/o left shoulder pain, states she was reach back with let arm and felt a "pop", stated also she has tried several OTC meds without any relief. Radiology studies done

## 2021-11-18 NOTE — Discharge Instructions (Signed)
Please follow-up with your physical therapist and your orthopedic doctor for her physical therapy referral recommendations.  Recommend Voltaren gel in addition to the Tylenol 1000 mg every 6 hours for pain.  He can also use Lidoderm patches which are over-the-counter.

## 2021-11-18 NOTE — ED Provider Notes (Signed)
[facility] EMERGENCY DEPARTMENT Provider Note   CSN: [id number] Arrival date & time: 11/18/21  1421     History  Chief Complaint  Patient presents with   Shoulder Pain    Christina Harrison is a 81 y.o. female.   Shoulder Pain Associated symptoms: no fever   Patient is an 81 year old female with past medical history significant for HTN and chronic back pain.  She is presented to the emergency room today after 1 week of left shoulder pain she states that she reached backwards approximately 1 week ago and felt a small pop in her shoulder she states that she immediately moved her arm back to his normal position and felt relief of pain however she states it has been somewhat achy since that time.  She denies any numbness or weakness in her hands and denies any neck pain.  No chest pain or difficulty breathing.  No lightheadedness or dizziness nausea or vomiting or diaphoresis.  She states that the achy pain is worse with certain movements.       Home Medications Prior to Admission medications   Medication Sig Start Date End Date Taking? Authorizing Provider  diclofenac Sodium (VOLTAREN) 1 % GEL Apply 4 grams topically 4 (four) times daily. 11/18/21  Yes Justine Cossin S, PA  lidocaine (LIDODERM) 5 % Place 1 patch onto the skin daily. Remove & Discard patch within 12 hours or as directed by MD 11/18/21  Yes Gailen Shelter, PA  HYDROcodone-acetaminophen (NORCO/VICODIN) 5-325 MG tablet Take 1-2 tablets by mouth every 6 (six) hours as needed. 08/04/17   Benjiman Core, MD  traMADol (ULTRAM) 50 MG tablet Take by mouth every 6 (six) hours as needed.    [provider]  UNKNOWN TO PATIENT     [provider]      Allergies    Patient has no known allergies.    Review of Systems   Review of Systems  Constitutional:  Negative for fever.  HENT:  Negative for congestion.   Respiratory:  Negative for shortness of breath.   Cardiovascular:  Negative for chest  pain.  Gastrointestinal:  Negative for abdominal distention.  Musculoskeletal:        Left shoulder pain  Neurological:  Negative for dizziness and headaches.   Physical Exam Updated Vital Signs BP (!) 169/75    Pulse 83    Temp 98.2 F (36.8 C) (Oral)    Resp 18    Ht 5\' 7"  (1.702 m)    Wt 77.6 kg    SpO2 94%    BMI 26.78 kg/m  Physical Exam Vitals and nursing note reviewed.  Constitutional:      General: She is not in acute distress.    Appearance: Normal appearance. She is not ill-appearing.  HENT:     Head: Normocephalic and atraumatic.     Mouth/Throat:     Mouth: Mucous membranes are moist.  Eyes:     General: No scleral icterus.       Right eye: No discharge.        Left eye: No discharge.     Conjunctiva/sclera: Conjunctivae normal.  Cardiovascular:     Rate and Rhythm: Normal rate.  Pulmonary:     Effort: Pulmonary effort is normal.     Breath sounds: No stridor.  Musculoskeletal:     Comments: No significant focal bony tenderness to palpation of left shoulder FROM of shoulder with some discomfort at all extremes.   Cap refill <  3 s  No wrist or elbow TTP  Neurological:     Mental Status: She is alert and oriented to person, place, and time. Mental status is at baseline.    ED Results / Procedures / Treatments   Labs (all labs ordered are listed, but only abnormal results are displayed) Labs Reviewed - No data to display  EKG None  Radiology DG Shoulder Left  Result Date: 11/18/2021 CLINICAL DATA:  Left shoulder pain EXAM: LEFT SHOULDER - 2+ VIEW COMPARISON:  None. FINDINGS: No acute fracture or dislocation identified. Mild glenohumeral joint space narrowing and moderate narrowing of the acromioclavicular joint. Small early marginal osteophytes. IMPRESSION: No acute osseous abnormality identified. Electronically Signed   By: Jannifer Hick M.D.   On: 11/18/2021 14:43    Procedures Procedures    Medications Ordered in ED Medications - No data to  display  ED Course/ Medical Decision Making/ A&P                           Medical Decision Making  Patient is an 81 year old female with past medical history significant for HTN and chronic back pain.  She is presented to the emergency room today after 1 week of left shoulder pain she states that she reached backwards approximately 1 week ago and felt a small pop in her shoulder she states that she immediately moved her arm back to his normal position and felt relief of pain however she states it has been somewhat achy since that time.  She denies any numbness or weakness in her hands and denies any neck pain.  No chest pain or difficulty breathing.  No lightheadedness or dizziness nausea or vomiting or diaphoresis.  She states that the achy pain is worse with certain movements.  Patient has left shoulder pain seems that is been ongoing for 1 week she denies any chest pain nausea vomiting diaphoresis.  Her left shoulder pain is recreated by her moving her shoulders at the extremes of the range of motion.  She is distally neurovascularly intact as evidenced by 3+ bilateral radial artery pulses and sensation she is also able to dorsiflex her wrist there is no wrist palsy.  X-ray reviewed there is no fractures I personally reviewed all imaging and interpreted results.  I agree of radiology read.  We will discharge patient home at this time. Voltaren gel and lidoderm patch.   Final Clinical Impression(s) / ED Diagnoses Final diagnoses:  Acute pain of left shoulder    Rx / DC Orders ED Discharge Orders          Ordered    diclofenac Sodium (VOLTAREN) 1 % GEL  4 times daily        11/18/21 1529    lidocaine (LIDODERM) 5 %  Every 24 hours        11/18/21 1529              Gailen Shelter, [place] 11/18/21 1637    Milagros Loll, MD 11/18/21 2144

## 2021-11-18 NOTE — ED Triage Notes (Signed)
C/o left shoulder pop/pain x 1 week ago after reaching back

## 2023-04-12 IMAGING — DX DG SHOULDER 2+V*L*
3 series · 3 of 3 positions shown · non-contrast
Comparison: None.

CLINICAL DATA: Left shoulder pain

EXAM:
LEFT SHOULDER - 2+ VIEW

[shoulder grashey]
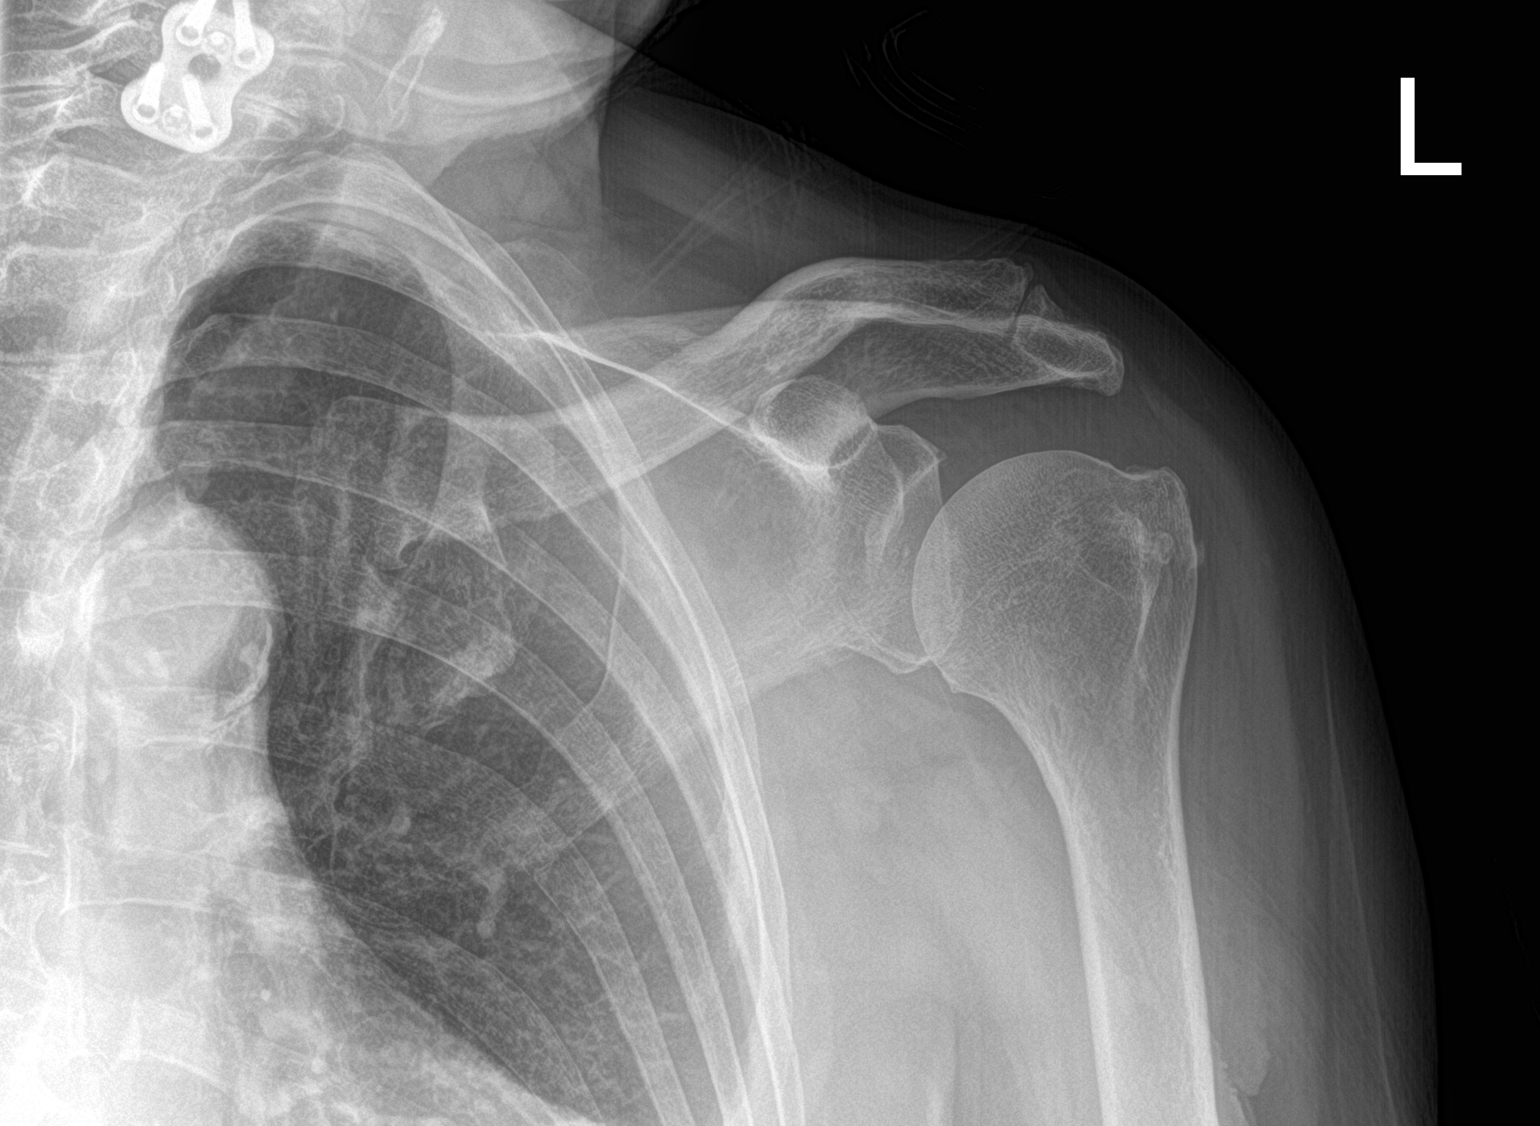

[shoulder y view]
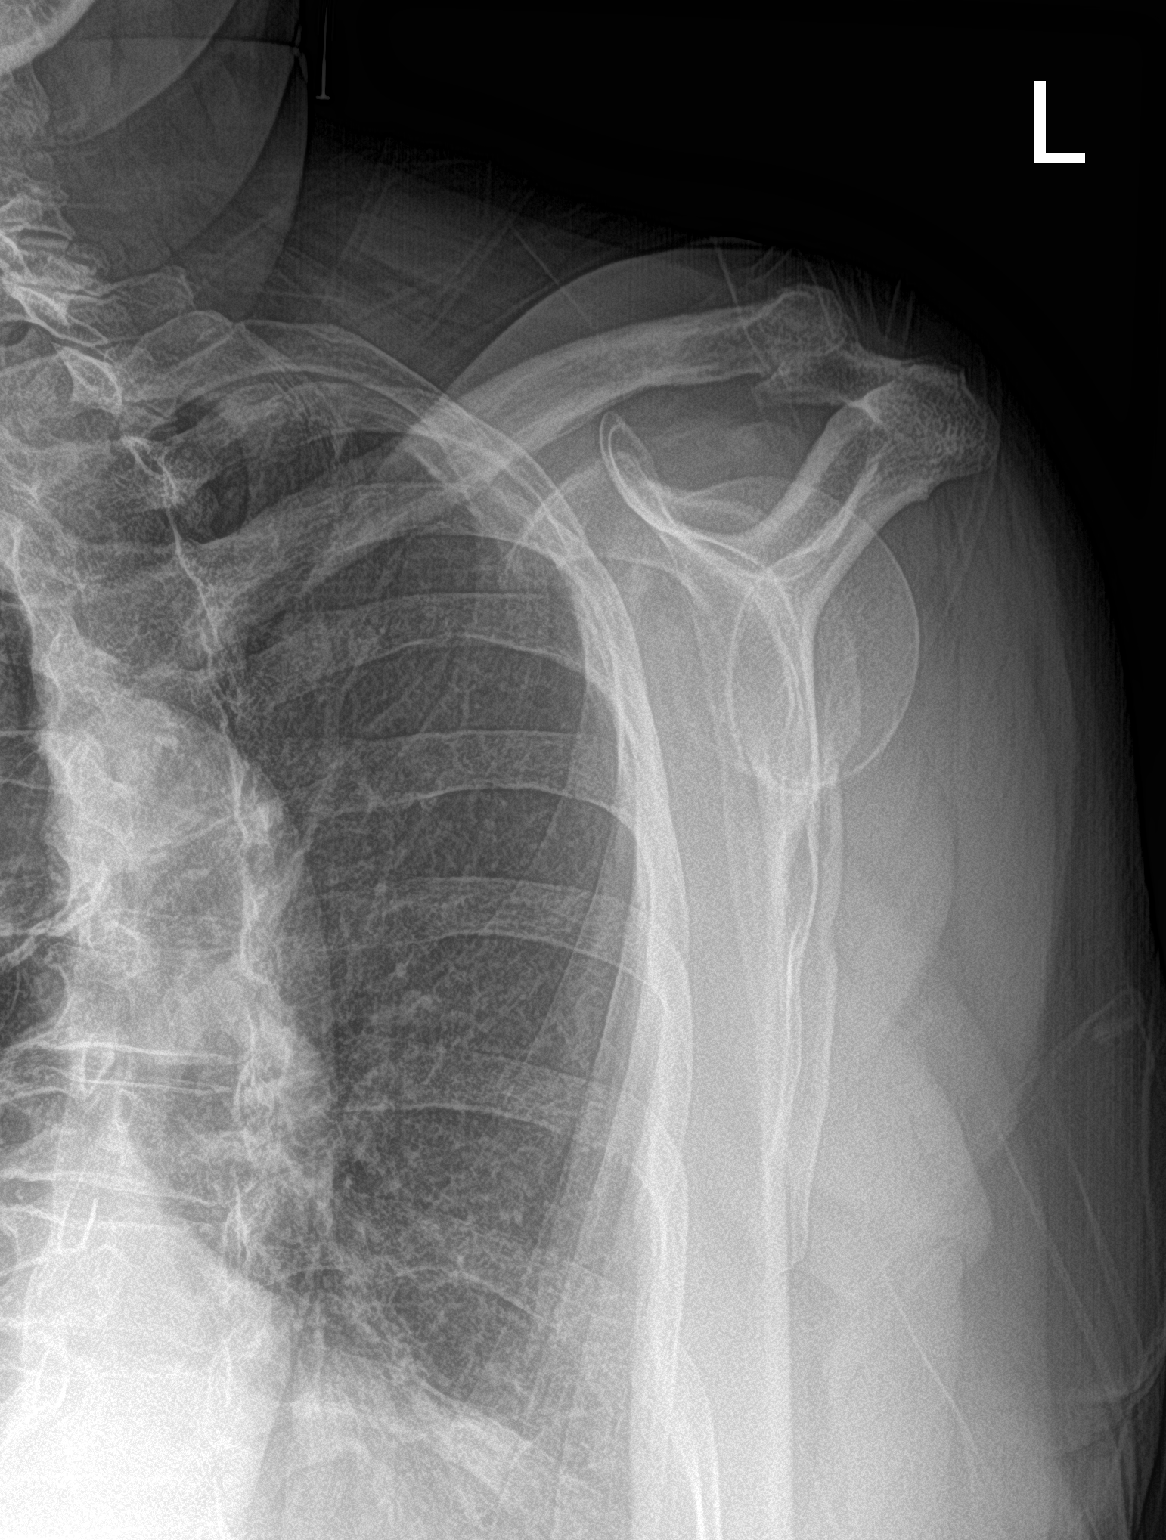

[shoulder axillary]
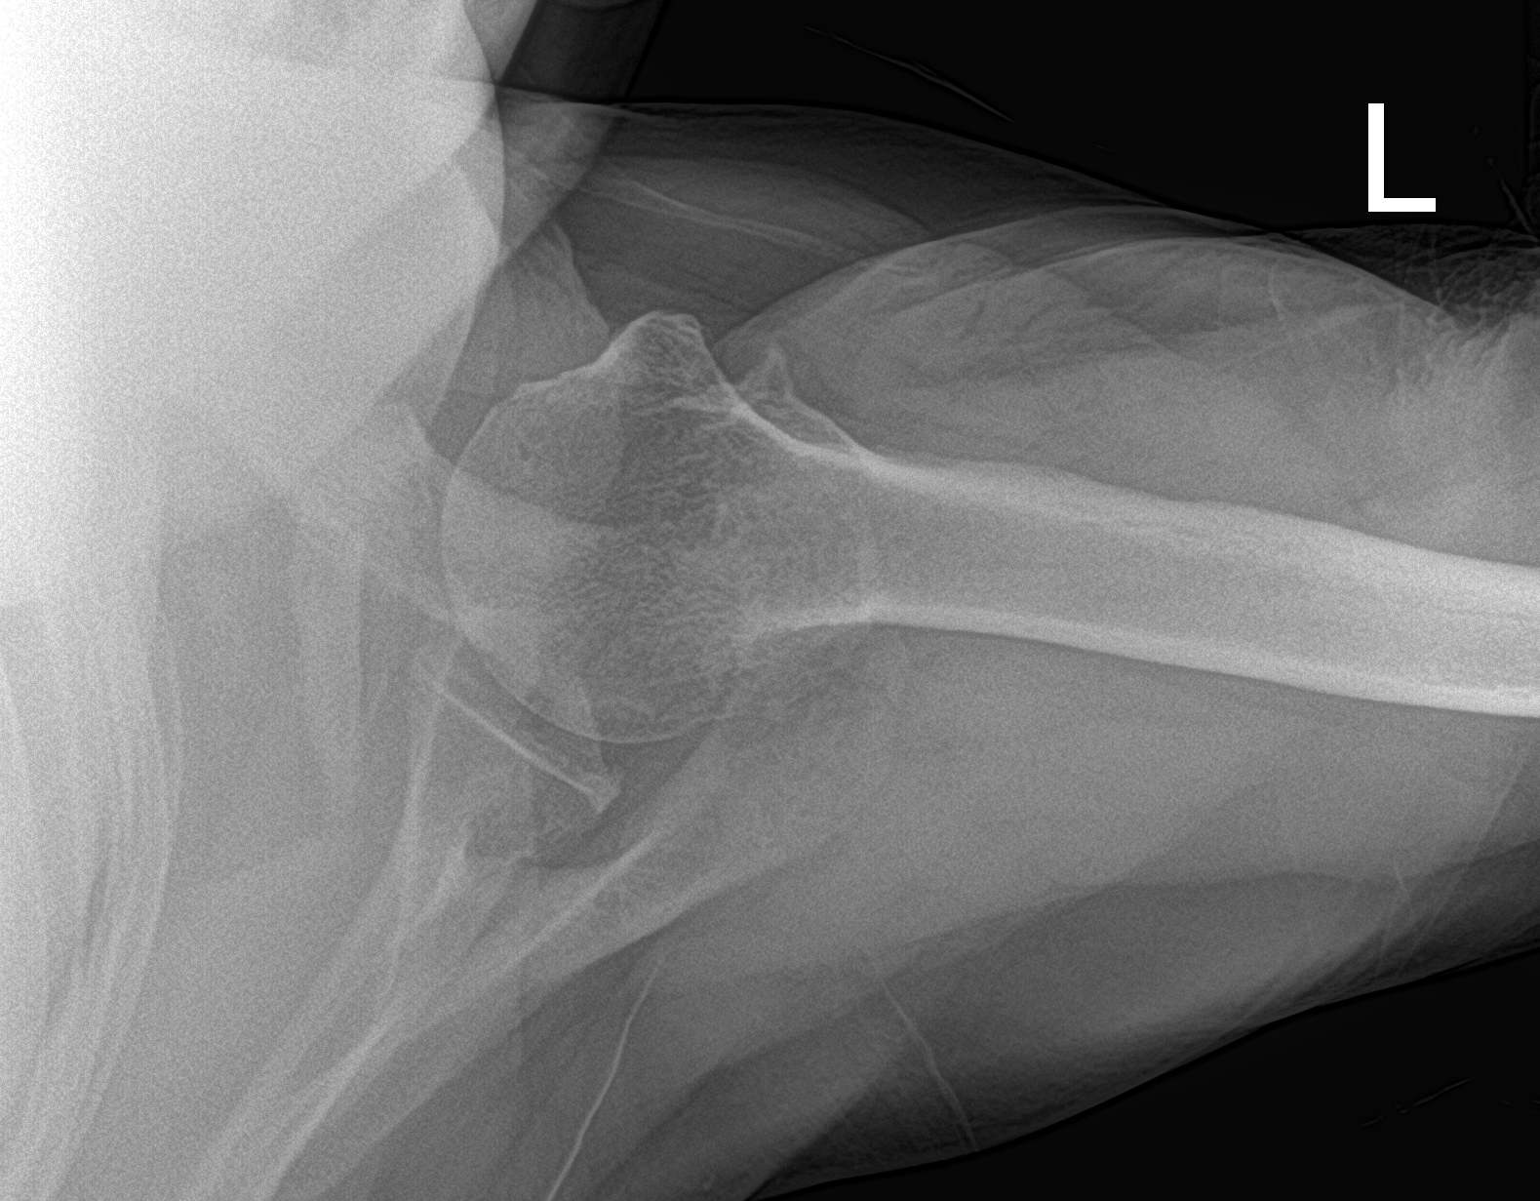

[3 of 3 positions shown; findings below may reference images not displayed]

FINDINGS: No acute fracture or dislocation identified. Mild glenohumeral joint
space narrowing and moderate narrowing of the acromioclavicular
joint. Small early marginal osteophytes.
IMPRESSION: No acute osseous abnormality identified.
